# Patient Record
Sex: Female | Born: 1975 | Race: White | Hispanic: No | Marital: Married | Smoking: Never smoker
Health system: Southern US, Community
[De-identification: ages and names within clinical notes are randomized; demographics above are authoritative.]

## PROBLEM LIST (undated history)

## (undated) DIAGNOSIS — I10 Essential (primary) hypertension: Secondary | ICD-10-CM

## (undated) DIAGNOSIS — R7303 Prediabetes: Secondary | ICD-10-CM

## (undated) DIAGNOSIS — Z87442 Personal history of urinary calculi: Secondary | ICD-10-CM

## (undated) DIAGNOSIS — R002 Palpitations: Secondary | ICD-10-CM

## (undated) HISTORY — PX: KIDNEY STONE SURGERY: SHX686

## (undated) HISTORY — PX: TUBAL LIGATION: SHX77

## (undated) HISTORY — PX: ABLATION: SHX5711

---

## 2006-01-25 ENCOUNTER — Encounter: Admission: RE | Admit: 2006-01-25 | Discharge: 2006-01-25 | Payer: Self-pay | Admitting: Obstetrics and Gynecology

## 2006-09-20 IMAGING — MG MM DIGITAL DIAGNOSTIC BILAT CAD
6 series · 6 of 6 positions shown · non-contrast
Comparison: none

DIGITAL BILATERAL DIAGNOSTIC MAMMOGRAM WITH CAD AND RIGHT BREAST ULTRASOUND:
CLINICAL DATA: The patient palpates a lump in the inner aspect of the right breast at 3 o'clock 
near the sternum.  Her physician felt a lump in the outer portion of the right breast.

[R CC]
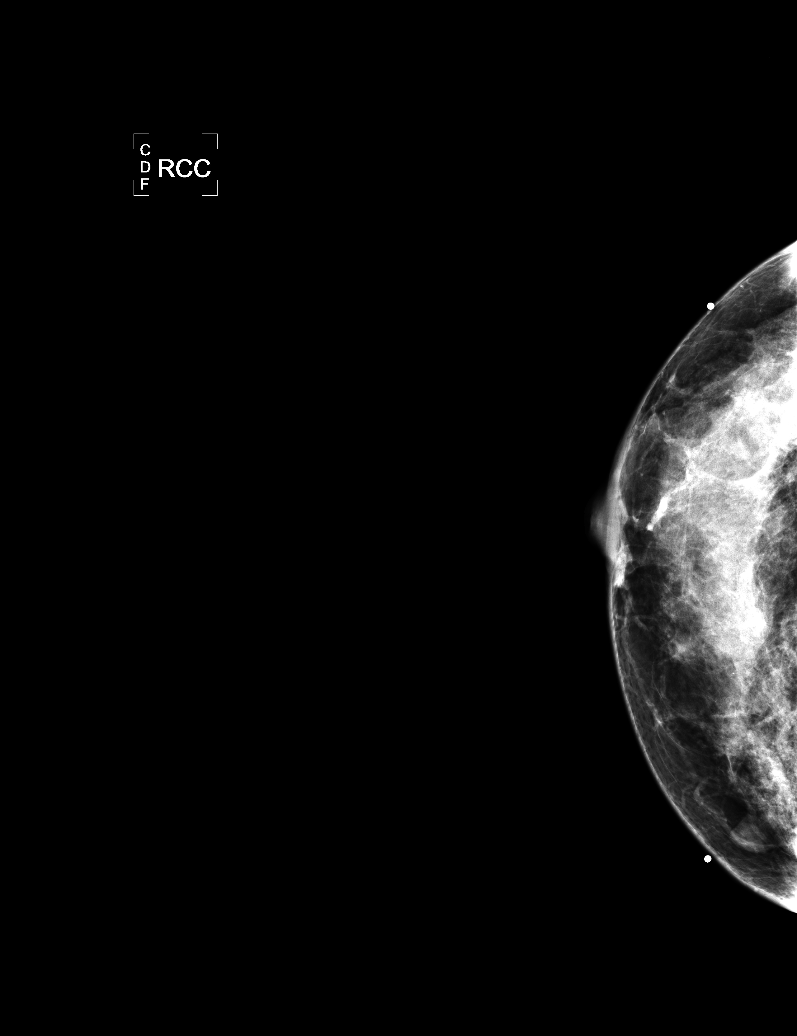

[L CC]
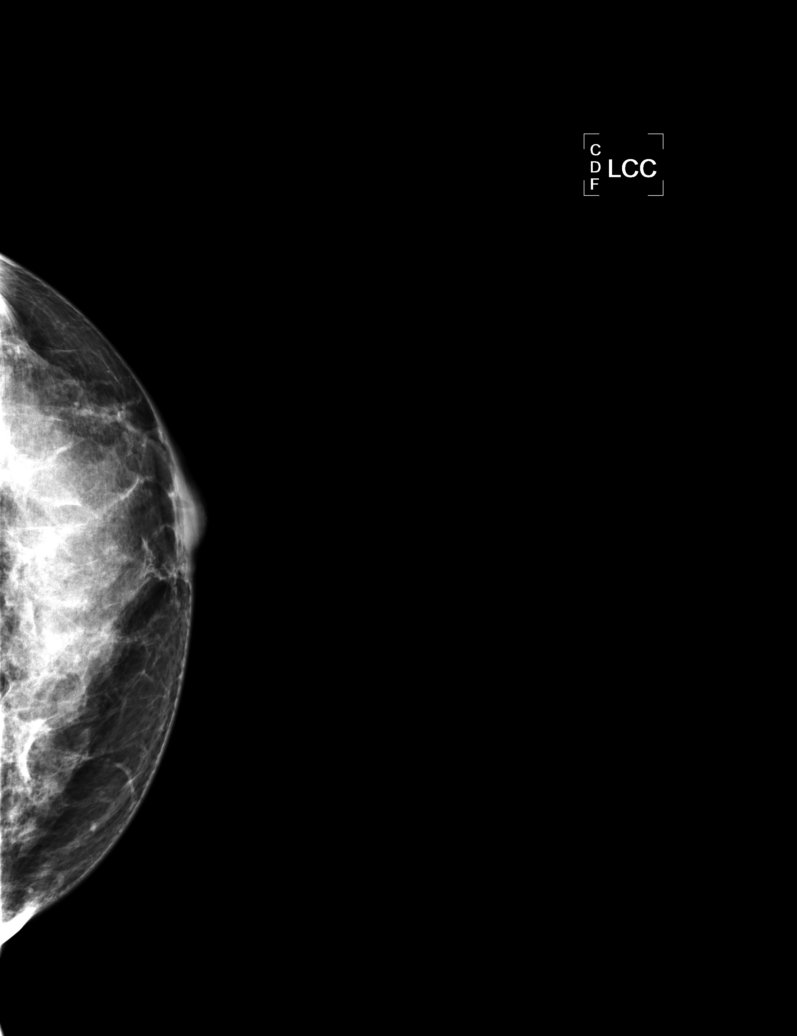

[L MLO]
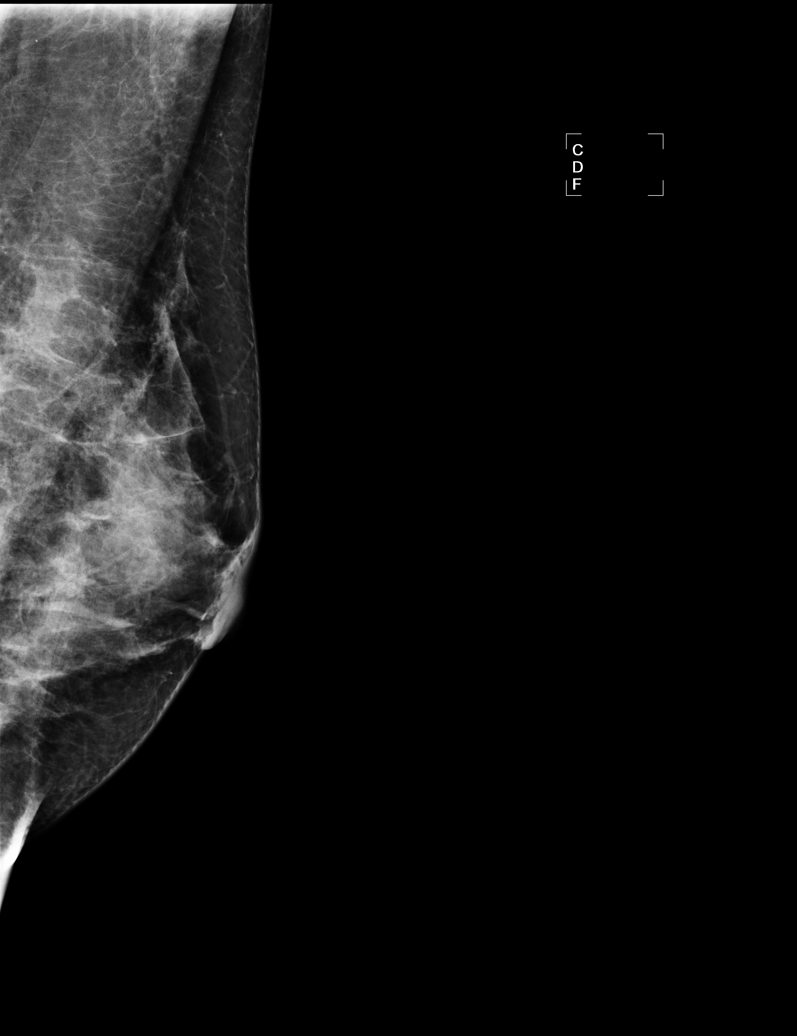

[R MLO]
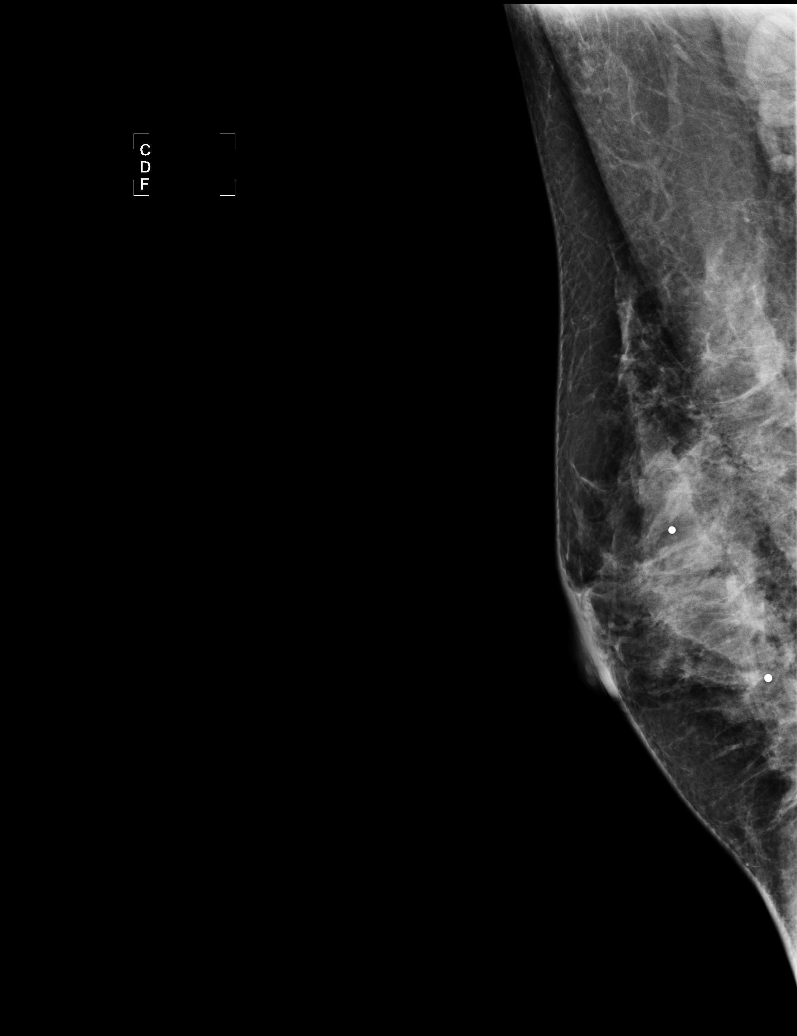

[R TAN (1 of 2)]
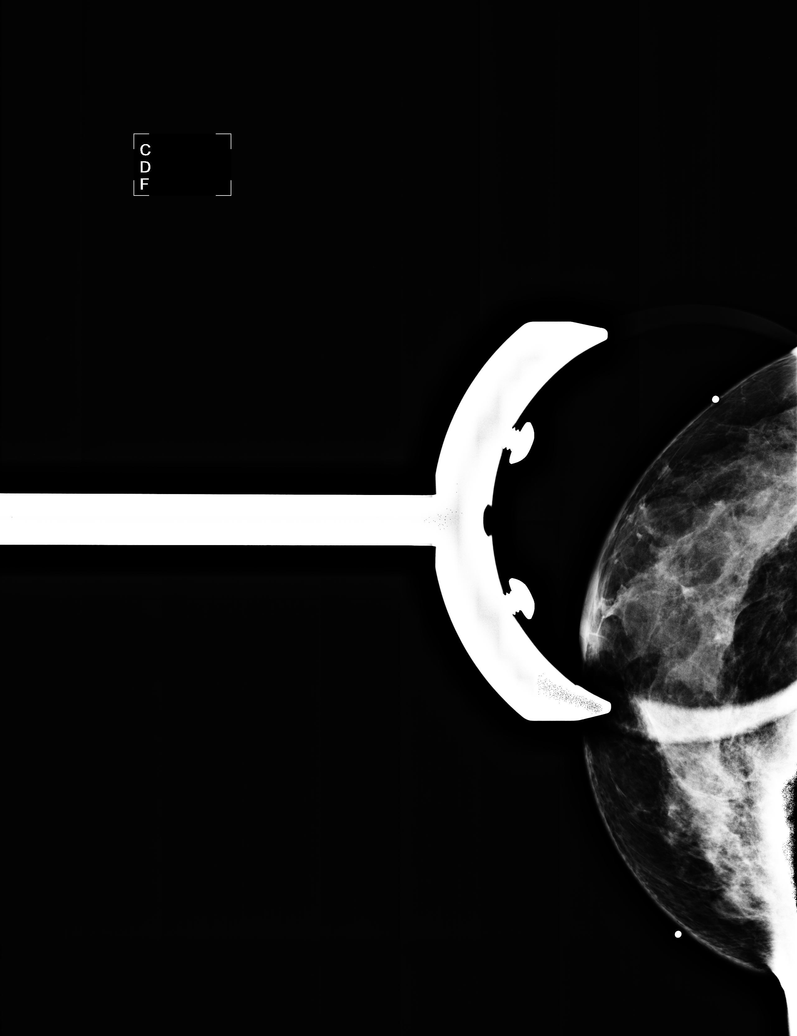

[R TAN (2 of 2)]
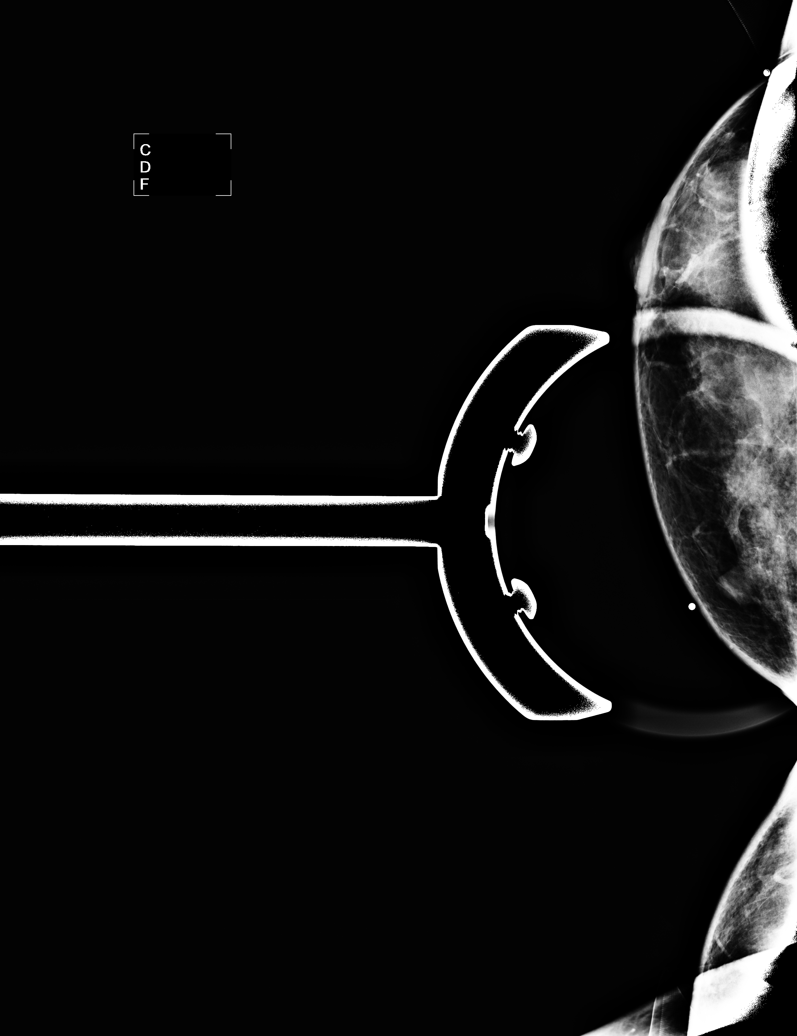

[6 of 6 positions shown; findings below may reference images not displayed]

This is the patient's initial study.  The breast tissue is extremely dense.  There is a round 
partially obscured, partially circumscribed nodule in the 3 o'clock position of the right breast 
far medially which corresponds to the palpable finding. No abnormality is noted in the outer 
portion of the right breast.  No abnormality is noted in the left breast.

On physical examination, I palpate a 1 cm nodule at 3 o'clock near the sternum on the right.  I do 
not palpate a mass in the outer portion of the right breast.    Sonography demonstrates a cyst at 3
o'clock measuring 8 x 6 x 8 mm.  There is some mobile debris within the cyst.

Sonography of the outer portion of the right breast demonstrates no mass, distortion or shadowing 
to suggest malignancy.
IMPRESSION: The palpable finding in the right breast at 3 o'clock corresponds to a small complicated cyst, a 
benign finding.  No abnormality is noted in the outer portion of the right breast and the patient 
and I are unable to palpate a mass in this area today.  Clinical follow-up of this area would be 
suggested.

Yearly screening mammography should beginning at age 40 unless clinically indicated earlier.

ASSESSMENT: Benign - BI-RADS 2

Routine screening mammogram at age 39.

## 2006-09-20 IMAGING — US US BREAST*R*
1 series · 6 of 6 positions shown · non-contrast
Comparison: none

DIGITAL BILATERAL DIAGNOSTIC MAMMOGRAM WITH CAD AND RIGHT BREAST ULTRASOUND:
CLINICAL DATA: The patient palpates a lump in the inner aspect of the right breast at 3 o'clock 
near the sternum.  Her physician felt a lump in the outer portion of the right breast.

[Series 1: us breast*right* · 6 of 6 slices shown]
[im 1/6]
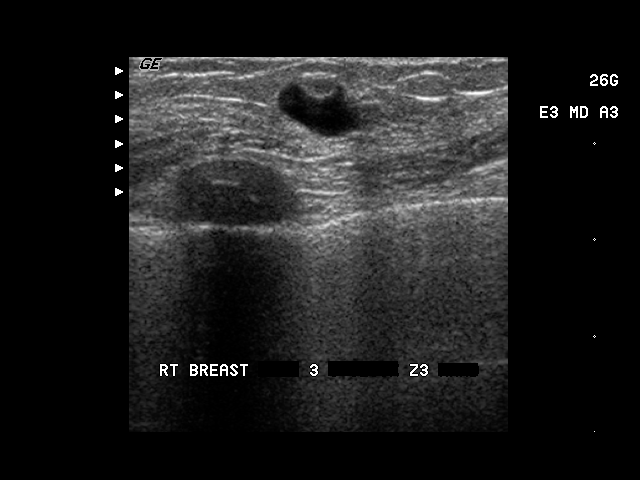
[im 2/6]
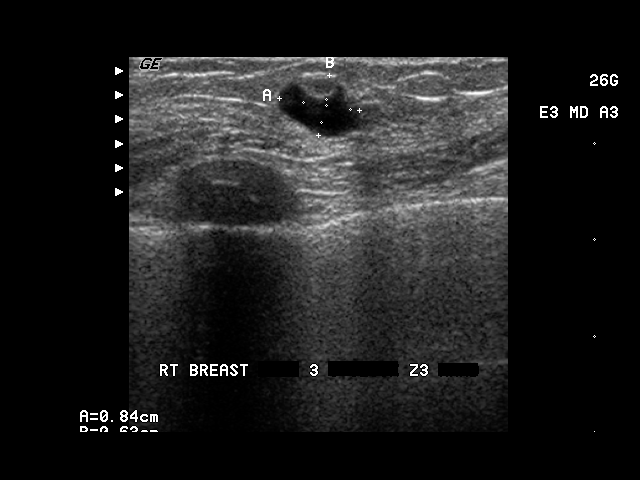
[im 3/6]
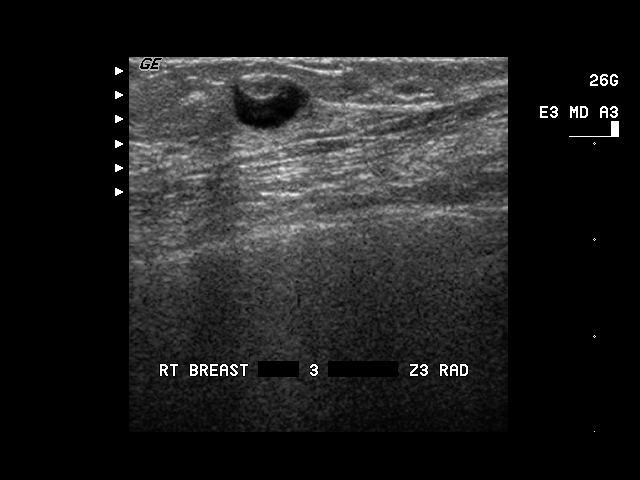
[im 4/6]
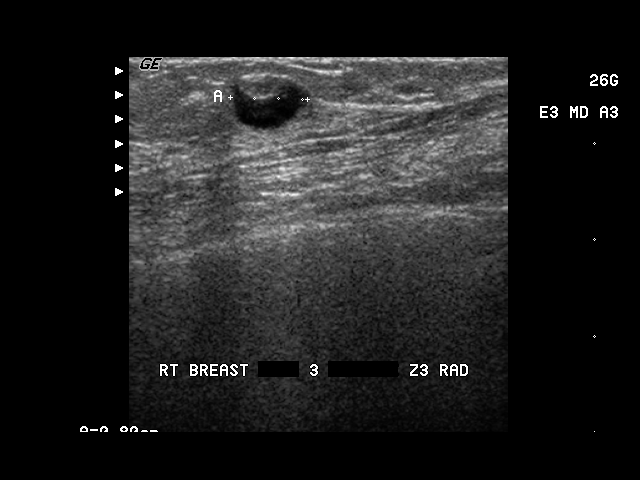
[im 5/6]
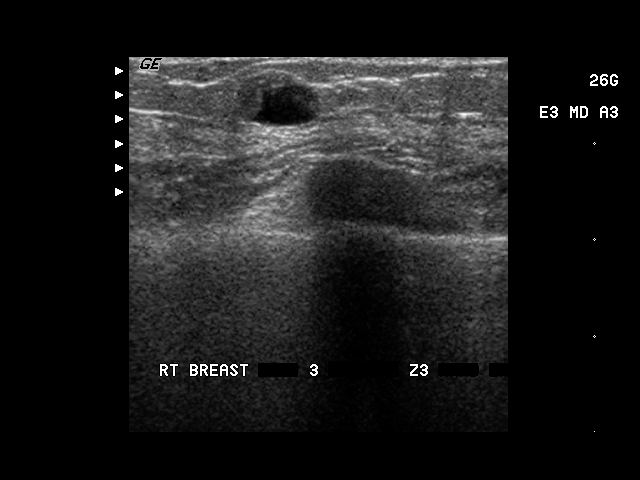
[im 6/6]
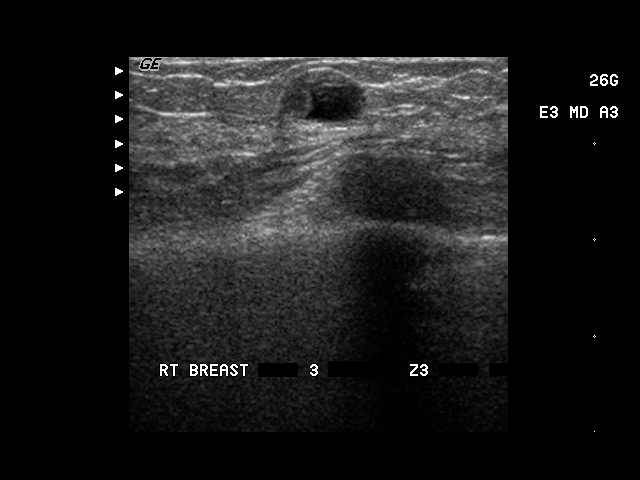

[6 of 6 positions shown; findings below may reference images not displayed]

This is the patient's initial study.  The breast tissue is extremely dense.  There is a round 
partially obscured, partially circumscribed nodule in the 3 o'clock position of the right breast 
far medially which corresponds to the palpable finding. No abnormality is noted in the outer 
portion of the right breast.  No abnormality is noted in the left breast.

On physical examination, I palpate a 1 cm nodule at 3 o'clock near the sternum on the right.  I do 
not palpate a mass in the outer portion of the right breast.    Sonography demonstrates a cyst at 3
o'clock measuring 8 x 6 x 8 mm.  There is some mobile debris within the cyst.

Sonography of the outer portion of the right breast demonstrates no mass, distortion or shadowing 
to suggest malignancy.
IMPRESSION: The palpable finding in the right breast at 3 o'clock corresponds to a small complicated cyst, a 
benign finding.  No abnormality is noted in the outer portion of the right breast and the patient 
and I are unable to palpate a mass in this area today.  Clinical follow-up of this area would be 
suggested.

Yearly screening mammography should beginning at age 40 unless clinically indicated earlier.

ASSESSMENT: Benign - BI-RADS 2

Routine screening mammogram at age 39.

## 2015-10-15 DIAGNOSIS — R002 Palpitations: Secondary | ICD-10-CM | POA: Insufficient documentation

## 2015-10-15 DIAGNOSIS — R7303 Prediabetes: Secondary | ICD-10-CM | POA: Insufficient documentation

## 2015-10-15 DIAGNOSIS — I10 Essential (primary) hypertension: Secondary | ICD-10-CM | POA: Insufficient documentation

## 2016-12-21 ENCOUNTER — Ambulatory Visit: Payer: PRIVATE HEALTH INSURANCE | Admitting: Sports Medicine

## 2016-12-22 ENCOUNTER — Ambulatory Visit (INDEPENDENT_AMBULATORY_CARE_PROVIDER_SITE_OTHER): Payer: PRIVATE HEALTH INSURANCE

## 2016-12-22 ENCOUNTER — Encounter: Payer: Self-pay | Admitting: Sports Medicine

## 2016-12-22 ENCOUNTER — Ambulatory Visit (INDEPENDENT_AMBULATORY_CARE_PROVIDER_SITE_OTHER): Payer: PRIVATE HEALTH INSURANCE | Admitting: Sports Medicine

## 2016-12-22 DIAGNOSIS — M2022 Hallux rigidus, left foot: Secondary | ICD-10-CM

## 2016-12-22 DIAGNOSIS — M25572 Pain in left ankle and joints of left foot: Secondary | ICD-10-CM | POA: Diagnosis not present

## 2016-12-22 DIAGNOSIS — M21619 Bunion of unspecified foot: Secondary | ICD-10-CM | POA: Diagnosis not present

## 2016-12-22 NOTE — Patient Instructions (Signed)
Hallux Rigidus Hallux rigidus is a type of joint pain or joint disease (arthritis) that affects your big toe (hallux). This condition involves the joint that connects the base of your big toe to the main part of your foot (metatarsophalangeal joint). This condition can cause your big toe to become stiff, painful, and difficult to move. Symptoms may get worse with movement or in cold or damp weather. The condition also gets worse over time. What are the causes? This condition may be caused by having a foot that does not function the way that it should or has an abnormal shape (structural deformity). These foot problems can run in families (be hereditary). This condition can also be caused by:  Injury.  Overuse.  Certain inflammatory diseases, including gout and rheumatoid arthritis. What increases the risk? This condition is more likely to develop in people who:  Have a foot bone (metatarsal) that is longer or higher than normal.  Have a family history of hallux rigidus.  Have previously injured their big toe.  Have feet that do not have a curve (arch) on the inner side of the foot. This may be called flat feet or fallen arches.  Turn their ankles in when they walk (pronation).  Have rheumatoid arthritis or gout.  Have to stoop down often at work. What are the signs or symptoms? Symptoms of this condition include:  Big toe pain.  Stiffness and difficulty moving the big toe.  Swelling of the toe and surrounding area.  Bone spurs. These are bony growths that can form on the joint of the big toe.  A limp. How is this diagnosed? This condition is diagnosed based on a medical history and physical exam. This may include X-rays. How is this treated? Treatment for this condition includes:  Wearing roomy, comfortable shoes that have a large toe box.  Putting orthotic devices in your shoes.  Pain medicines.  Physical therapy.  Icing the injured area.  Alternate between  putting your foot in cold water then warm water. If your condition is severe, treatment may include:  Corticosteroid injections to relieve pain.  Surgery to remove bone spurs, fuse damaged bones together, or replace the entire joint. Follow these instructions at home:  Take over-the-counter and prescription medicines only as told by your health care provider.  Do not wear high heels or other restrictive footwear. Wear comfortable, supportive shoes that have a large toe box.  Wear orthotics as told by your health care provider, if this applies.  Put your feet in cold water for 30 seconds, then in warm water for 30 seconds. Alternate between the cold and warm water for 5 minutes. Do this several times a day or as told by your health care provider.  If directed, apply ice to the injured area.  Put ice in a plastic bag.  Place a towel between your skin and the bag.  Leave the ice on for 20 minutes, 2-3 times per day.  Do foot exercises as instructed by your health care provider or a physical therapist.  Keep all follow-up visits as told by your health care provider. This is important. Contact a health care provider if:  You notice bone spurs or growths on or around your big toe.  Your pain does not get better or it gets worse.  You have pain while resting.  You have pain in other parts of your body, such as your back, hip, or knee.  You start to limp. This information is not intended   to replace advice given to you by your health care provider. Make sure you discuss any questions you have with your health care provider. Document Released: 09/19/2005 Document Revised: 02/25/2016 Document Reviewed: 05/27/2015 Elsevier Interactive Patient Education  2017 [employer]  

## 2016-12-22 NOTE — Progress Notes (Signed)
Subjective: Judy Chavez is a 41 y.o. female patient who presents to office for evaluation of Left bunion pain. Patient complains of progressive pain especially over the last year in the Left foot that starts as pain over the bump with direct pressure and range of motion; patient now has difficulty fitting shoes comfortably. Reports pain really got bad when on Sunday a toddler at her nursery at church stepped on her foot. Patient has tried changing shoes with no improvement in symptoms. Patient denies any other pedal complaints.   Patient Active Problem List   Diagnosis Date Noted  . Essential hypertension 10/15/2015  . Palpitations 10/15/2015  . Prediabetes 10/15/2015    No current outpatient prescriptions on file prior to visit.   No current facility-administered medications on file prior to visit.     Allergies  Allergen Reactions  . Amlodipine Other (See Comments)    unknown    Objective:  General: Alert and oriented x3 in no acute distress  Dermatology: No open lesions bilateral lower extremities, no webspace macerations, no ecchymosis bilateral, all nails x 10 are well manicured.  Vascular: Dorsalis Pedis and Posterior Tibial pedal pulses 2/4, Capillary Fill Time 3 seconds, (+) pedal hair growth bilateral, no edema bilateral lower extremities, Temperature gradient within normal limits.  Neurology: Gross sensation intact via light touch bilateral. (-) Tinels sign left foot.   Musculoskeletal: Mild tenderness with palpation left dorsal bunion deformity, + limitation with range of motion, less than 30 of dorsiflexion and 5 and plantarflexion at left big toe joint, supportive of hallux limitus/rigidus.   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 No other acute findings.      Assessment and Plan: Problem List Items Addressed This Visit    None    Visit Diagnoses    Hallux rigidus of left foot     -  Primary   Relevant Orders   DG Foot 2 Views Left   Toe joint pain, left           -Complete examination performed -Xrays reviewed -Discussed treatement options; discussed Hallux rigidus deformity;conservative and  Surgical management; risks, benefits, alternatives discussed. All patient's questions answered.   -Rx meloxicam attempted to call Patient to get pharmacy to send medicine; will have office staff to reach Patient to send medicine -Recommend continue with good supportive shoes and inserts.  -Recommend topical pain creams or rubs Recommend heat and ice therapy -Dispense offloading pad to offload the first metatarsophalangeal joint and instructed on use -Advised patient to consider surgery if felt to continue to improve with conservative measures -Patient to return to office as needed or sooner if condition worsens.  Asencion Islamitorya Markayla Reichart, DPM

## 2016-12-23 ENCOUNTER — Telehealth: Payer: Self-pay | Admitting: *Deleted

## 2016-12-23 NOTE — Telephone Encounter (Signed)
-----   Message from Asencion Islamitorya Stover, [place] sent at 12/22/2016  5:06 PM EDT ----- Regarding: Send Mobic to Pharmacy  Patient left office today and my nurse did not get pharmacy. I tried calling the patient and she did not answer and the voicemail was full. Can you please call her to get her Pharmacy and send Meloxicam 15mg  daily (30 tabs) to pharmacy to use for hallux rigidus, left big toe joint pain. Thanks Dr. Marylene LandStover

## 2016-12-23 NOTE — Telephone Encounter (Signed)
I attempted to contact the patient again and was unable to leave a message

## 2016-12-26 NOTE — Telephone Encounter (Signed)
Left message on mobile requesting local pharmacy to call in antiinflammatory pain medication. Left message on work phone to call with local pharmacy.

## 2018-02-12 ENCOUNTER — Other Ambulatory Visit: Payer: Self-pay | Admitting: Cardiology

## 2018-02-14 ENCOUNTER — Other Ambulatory Visit: Payer: Self-pay | Admitting: Cardiology

## 2023-11-13 ENCOUNTER — Other Ambulatory Visit (HOSPITAL_BASED_OUTPATIENT_CLINIC_OR_DEPARTMENT_OTHER): Payer: Self-pay | Admitting: Family

## 2023-11-13 DIAGNOSIS — R102 Pelvic and perineal pain: Secondary | ICD-10-CM

## 2023-11-13 DIAGNOSIS — R1011 Right upper quadrant pain: Secondary | ICD-10-CM

## 2023-11-13 DIAGNOSIS — R1031 Right lower quadrant pain: Secondary | ICD-10-CM

## 2023-11-16 ENCOUNTER — Ambulatory Visit (INDEPENDENT_AMBULATORY_CARE_PROVIDER_SITE_OTHER)
Admission: RE | Admit: 2023-11-16 | Discharge: 2023-11-16 | Disposition: A | Discharge status: Home / Self Care | Payer: Managed Care, Other (non HMO) | Source: Ambulatory Visit | Attending: Family | Admitting: Family

## 2023-11-16 DIAGNOSIS — R102 Pelvic and perineal pain: Secondary | ICD-10-CM

## 2023-11-16 DIAGNOSIS — R1031 Right lower quadrant pain: Secondary | ICD-10-CM

## 2023-11-16 DIAGNOSIS — R1011 Right upper quadrant pain: Secondary | ICD-10-CM | POA: Diagnosis not present

## 2023-11-16 MED ORDER — IOHEXOL 300 MG/ML  SOLN
100.0000 mL | Freq: Once | INTRAMUSCULAR | Status: AC | PRN
  Administered 2023-11-16: 100 mL via INTRAVENOUS

## 2023-12-12 ENCOUNTER — Other Ambulatory Visit: Payer: Self-pay | Admitting: Urology

## 2023-12-15 ENCOUNTER — Encounter (HOSPITAL_COMMUNITY): Payer: Self-pay

## 2023-12-15 NOTE — Patient Instructions (Signed)
 SURGICAL WAITING ROOM VISITATION  Patients having surgery or a procedure may have no more than 2 support people in the waiting area - these visitors may rotate.    Children under the age of 95 must have an adult with them who is not the patient.  Due to an increase in RSV and influenza rates and associated hospitalizations, children ages 74 and under may not visit patients in [facility] hospitals.  Visitors with respiratory illnesses are discouraged from visiting and should remain at home.  If the patient needs to stay at the hospital during part of their recovery, the visitor guidelines for inpatient rooms apply. Pre-op nurse will coordinate an appropriate time for 1 support person to accompany patient in pre-op.  This support person may not rotate.    Please refer to the [facility] website for the visitor guidelines for Inpatients (after your surgery is over and you are in a regular room).       Your procedure is scheduled on: 12-21-23   Report to [facility] Main Entrance    Report to admitting at      10:00  AM   Call this number if you have problems the morning of surgery [phone]   Do not eat food :After Midnight.   After Midnight you may have the following liquids until _0600 _____ AM/  DAY OF SURGERY  then nothing by mouth  Water Non-Citrus Juices (without pulp, NO RED-Apple, White grape, White cranberry) Black Coffee (NO MILK/CREAM OR CREAMERS, sugar ok)  Clear Tea (NO MILK/CREAM OR CREAMERS, sugar ok) regular and decaf                             Plain Jell-O (NO RED)                                           Fruit ices (not with fruit pulp, NO RED)                                     Popsicles (NO RED)                                                               Sports drinks like Gatorade (NO RED)                           If you have questions, please contact your surgeon's office.   FOLLOW ANY ADDITIONAL PRE OP INSTRUCTIONS YOU RECEIVED FROM  YOUR SURGEON'S OFFICE!!!     Oral Hygiene is also important to reduce your risk of infection.                                    Remember - BRUSH YOUR TEETH THE MORNING OF SURGERY WITH YOUR REGULAR TOOTHPASTE  DENTURES WILL BE REMOVED PRIOR TO SURGERY PLEASE DO NOT APPLY "Poly grip" OR ADHESIVES!!!   Do NOT smoke after Midnight  Stop all vitamins and herbal supplements 7 days before surgery.   Take these medicines the morning of surgery with A SIP OF WATER: NONE    Bring CPAP mask and tubing day of surgery.                              You may not have any metal on your body including hair pins, jewelry, and body piercing             Do not wear make-up, lotions, powders, perfumes/cologne, or deodorant  Do not wear nail polish including gel and S&S, artificial/acrylic nails, or any other type of covering on natural nails including finger and toenails. If you have artificial nails, gel coating, etc. that needs to be removed by a nail salon please have this removed prior to surgery or surgery may need to be canceled/ delayed if the surgeon/ anesthesia feels like they are unable to be safely monitored.   Do not shave  48 hours prior to surgery.          Do not bring valuables to the hospital. [facility] IS NOT             RESPONSIBLE   FOR VALUABLES.   Contacts, glasses, dentures or bridgework may not be worn into surgery.   Bring small overnight bag day of surgery.   DO NOT BRING YOUR HOME MEDICATIONS TO THE HOSPITAL. PHARMACY WILL DISPENSE MEDICATIONS LISTED ON YOUR MEDICATION LIST TO YOU DURING YOUR ADMISSION IN THE HOSPITAL!    Patients discharged on the day of surgery will not be allowed to drive home.  Someone NEEDS to stay with you for the first 24 hours after anesthesia.   Special Instructions: Bring a copy of your healthcare power of attorney and living will documents the day of surgery if you haven't scanned them before.              Please read over the following  fact sheets you were given: IF YOU HAVE QUESTIONS ABOUT YOUR PRE-OP INSTRUCTIONS PLEASE CALL [phone]    If you test positive for Covid or have been in contact with anyone that has tested positive in the last 10 days please notify you surgeon.    [place] - Preparing for Surgery Before surgery, you can play an important role.  Because skin is not sterile, your skin needs to be as free of germs as possible.  You can reduce the number of germs on your skin by washing with CHG (chlorahexidine gluconate) soap before surgery.  CHG is an antiseptic cleaner which kills germs and bonds with the skin to continue killing germs even after washing. Please DO NOT use if you have an allergy to CHG or antibacterial soaps.  If your skin becomes reddened/irritated stop using the CHG and inform your nurse when you arrive at Short Stay. Do not shave (including legs and underarms) for at least 48 hours prior to the first CHG shower.  You may shave your face/neck. Please follow these instructions carefully:  1.  Shower with CHG Soap the night before surgery and the  morning of Surgery.  2.  If you choose to wash your hair, wash your hair first as usual with your  normal  shampoo.  3.  After you shampoo, rinse your hair and body thoroughly to remove the  shampoo.  4.  Use CHG as you would any other liquid soap.  You can apply chg directly  to the skin and wash                       Gently with a scrungie or clean washcloth.  5.  Apply the CHG Soap to your body ONLY FROM THE NECK DOWN.   Do not use on face/ open                           Wound or open sores. Avoid contact with eyes, ears mouth and genitals (private parts).                       Wash face,  Genitals (private parts) with your normal soap.             6.  Wash thoroughly, paying special attention to the area where your surgery  will be performed.  7.  Thoroughly rinse your body with warm water from the neck down.  8.  DO NOT  shower/wash with your normal soap after using and rinsing off  the CHG Soap.                9.  Pat yourself dry with a clean towel.            10.  Wear clean pajamas.            11.  Place clean sheets on your bed the night of your first shower and do not  sleep with pets. Day of Surgery : Do not apply any lotions/deodorants the morning of surgery.  Please wear clean clothes to the hospital/surgery center.  FAILURE TO FOLLOW THESE INSTRUCTIONS MAY RESULT IN THE CANCELLATION OF YOUR SURGERY PATIENT SIGNATURE_________________________________  NURSE SIGNATURE__________________________________  ________________________________________________________________________

## 2023-12-15 NOTE — Progress Notes (Addendum)
 PCP - Santo Held, NP [facility] [place], [place] Cardiologist - [facility] LOV 01-03-2017  PPM/ICD -  Device Orders -  Rep Notified -   Chest x-ray -  EKG -  Stress Test -  ECHO -  Cardiac Cath -   Sleep Study -  CPAP -   Fasting Blood Sugar -  Checks Blood Sugar _____ times a day  Blood Thinner Instructions: Aspirin Instructions:  ERAS Protcol - PRE-SURGERY n/a   COVID vaccine -yes  Activity--Able to climb a flight of stairs without CP or SOB Anesthesia review:   Patient denies shortness of breath, fever, cough and chest pain at PAT appointment   All instructions explained to the patient, with a verbal understanding of the material. Patient agrees to go over the instructions while at home for a better understanding. Patient also instructed to self quarantine after being tested for COVID-19. The opportunity to ask questions was provided.

## 2023-12-19 NOTE — H&P (Signed)
 48 year old female with a history of bilateral nephrolithiasis. She was seen by her PCP with a history of right sided pain that has been present for several years per patient she had ultrasound Leukine which she stated was normal unclear if this is a renal ultrasound. Encouraged about 2-3 times a week. Patient also states she has frequency with urination.   PMH:HTN  ZHY:QMVHQ ligatoin, D&C, uterine ablation   Nephrolithiasis:  12/04/2023: Right UPJ stone 7 mm in diameter, small 3 to 4 mm left-sided stones. previous Hx of stone in 2005, patient is still occasionally having pain on that side. Pain only on the R side. No GH, dysuria. No infectious symptoms. Patient had URS in 2005. Ultrasound shows no hydronephrosis, x-ray demonstrates no calcifications along the length of the ureter and previous location noted on CT.   12/21/23: PT failed TOP and would like to proceed with URS. She is here today for URS.     ALLERGIES: None   MEDICATIONS: MAGNESIUM GLYCINATE Daily  MULTI FOR HER Daily  TELMISARTAN PO Daily     GU PSH: Endometrial Ablation - about 2009     NON-GU PSH: Anesth Kidney Stone Destruct Anesth, Tubal Ligation - about 2007 Colonoscopy - about 07/03/2022     GU PMH: None   NON-GU PMH: Hypertension    FAMILY HISTORY: Colon Cancer - Grandmother Diabetes - Sister Generalized Multiple Sclerosis - Mother Parkinson's Disease - Father   SOCIAL HISTORY: Marital Status: Married Preferred Language: English; Ethnicity: Not Hispanic Or Latino; Race: White Current Smoking Status: Patient has never smoked.  Does not use smokeless tobacco. Has never drank.  Does not use drugs. Drinks 3 caffeinated drinks per day. Has not had a blood transfusion. Patient's occupation Industrial/product designer .    REVIEW OF SYSTEMS:    GU Review Female:   Patient reports frequent urination, get up at night to urinate, and leakage of urine. Patient denies hard to postpone urination, burning  /pain with urination, stream starts and stops, trouble starting your stream, have to strain to urinate, and being pregnant.  Gastrointestinal (Upper):   Patient reports nausea. Patient denies vomiting and indigestion/ heartburn.  Gastrointestinal (Lower):   Patient reports diarrhea. Patient denies constipation.  Constitutional:   Patient reports fatigue. Patient denies fever, night sweats, and weight loss.  Skin:   Patient denies skin rash/ lesion and itching.  Eyes:   Patient denies blurred vision and double vision.  Ears/ Nose/ Throat:   Patient denies sore throat and sinus problems.  Hematologic/Lymphatic:   Patient denies swollen glands and easy bruising.  Cardiovascular:   Patient denies leg swelling and chest pains.  Respiratory:   Patient denies cough and shortness of breath.  Endocrine:   Patient denies excessive thirst.  Musculoskeletal:   Patient denies back pain and joint pain.  Neurological:   Patient denies headaches and dizziness.  Psychologic:   Patient denies depression and anxiety.   VITAL SIGNS:      12/04/2023 10:08 AM  BP 141/82 mmHg  Pulse 84 /min  Temperature 97.8 F / 36.5 C   MULTI-SYSTEM PHYSICAL EXAMINATION:    Constitutional: Well-nourished. No physical deformities. Normally developed. Good grooming.  Respiratory: No labored breathing, no use of accessory muscles.   Cardiovascular: Normal temperature, normal extremity pulses, no swelling, no varicosities.  Skin: No paleness, no jaundice, no cyanosis. No lesion, no ulcer, no rash.  Musculoskeletal: Normal gait and station of head and neck.     Complexity of Data:  Source Of  History:  Patient  Records Review:   Previous Patient Records  Urine Test Review:   Urinalysis  X-Ray Review: KUB: Reviewed Films. Lower pole kidney stone right side, upper pole kidney stone left side no calcifications along the ureter. Renal Ultrasound: Reviewed Films. No hydronephrosis bilaterally right kidney with right sided inferior  pole stone left kidney with multiple nonobstructing stones C.T. Abdomen/Pelvis: Reviewed Films. Reviewed Report. Multiple nonobstructing stones in left kidney no hydronephrosis noted. Mild hydronephrosis right side with a right UPJ stone no kidney masses bilaterally    PROCEDURES:         KUB - 78295  A single view of the abdomen is obtained.      Patient confirmed No Neulasta OnPro Device.            Renal Ultrasound - 62130  Right Kidney: Length: 11.1 cm Depth: 4.6 cm Cortical Width: 1.3 cm Width: 4.6 cm  Left Kidney: Length: 12.0 cm Depth: 5.6 cm Cortical Width: 1.7 cm Width: 5.7 cm  Left Kidney/Ureter:  Multiple non-obstructing calcifications   Right Kidney/Ureter:  Hydro resolved. Non-obstructing calcification LP.  Bladder:  PVR 63.59 ml      Patient confirmed No Neulasta OnPro Device.           Visit Complexity - G2211          Urinalysis Dipstick Dipstick Cont'd  Color: Yellow Bilirubin: Neg mg/dL  Appearance: Clear Ketones: Neg mg/dL  Specific Gravity: 8.657 Blood: Neg ery/uL  pH: 6.0 Protein: Neg mg/dL  Glucose: Neg mg/dL Urobilinogen: 0.2 mg/dL    Nitrites: Neg    Leukocyte Esterase: Neg leu/uL    ASSESSMENT:      ICD-10 Details  1 GU:   Ureteral calculus - N20.1 Undiagnosed New Problem   PLAN:           Orders Labs Uric Acid, PTH w/Calcium, Vitamin D 25 and 1,25  X-Rays: KUB    Renal Ultrasound          Schedule X-Rays: 6 Months - KUB  Return Visit/Planned Activity: 6 Months - Office Visit          Document Letter(s):  Created for Patient: Clinical Summary         Notes:   Nephrolithiasis: UA negative for blood, KUB and ultrasound reassuring no residual stone. Will plan to follow-up in 6 months with 24-hour urine stone labs today and KUB. Gave general stone prevention guidelines including increased fluid intake, limiting salt, increasing citrate intake. Patient instructed to call back if pain in right side worsens.   We discussed risk  benefits alternatives to ureteroscopy.  This included bleeding infection and damage to surrounding structures surrounding structures including ureter as well as urethra.  We discussed the need for stent postoperatively as well as the potential symptoms of stent placement.  We discussed possible inability to complete procedure due to caliber of your ureter or inability to pass stone possibly requiring long-term stent versus nephrostomy tube.  We discussed need for possible second surgery.  Patient voiced their understanding and consent was obtained.

## 2023-12-20 ENCOUNTER — Encounter (HOSPITAL_COMMUNITY): Payer: Self-pay

## 2023-12-20 ENCOUNTER — Other Ambulatory Visit: Payer: Self-pay

## 2023-12-20 ENCOUNTER — Encounter (HOSPITAL_COMMUNITY)
Admission: RE | Admit: 2023-12-20 | Discharge: 2023-12-20 | Disposition: A | Discharge status: Home / Self Care | Source: Ambulatory Visit | Attending: Urology | Admitting: Urology

## 2023-12-20 VITALS — BP 137/98 | HR 86 | Temp 98.7°F | Resp 16 | Ht 64.0 in | Wt 142.0 lb

## 2023-12-20 DIAGNOSIS — R9431 Abnormal electrocardiogram [ECG] [EKG]: Secondary | ICD-10-CM | POA: Diagnosis not present

## 2023-12-20 DIAGNOSIS — Z01818 Encounter for other preprocedural examination: Secondary | ICD-10-CM | POA: Diagnosis present

## 2023-12-20 DIAGNOSIS — I10 Essential (primary) hypertension: Secondary | ICD-10-CM | POA: Insufficient documentation

## 2023-12-20 HISTORY — DX: Palpitations: R00.2

## 2023-12-20 HISTORY — DX: Prediabetes: R73.03

## 2023-12-20 HISTORY — DX: Essential (primary) hypertension: I10

## 2023-12-20 HISTORY — DX: Personal history of urinary calculi: Z87.442

## 2023-12-20 LAB — BASIC METABOLIC PANEL
Anion gap: 8 (ref 5–15)
BUN: 13 mg/dL (ref 6–20)
CO2: 24 mmol/L (ref 22–32)
Calcium: 8.9 mg/dL (ref 8.9–10.3)
Chloride: 105 mmol/L (ref 98–111)
Creatinine, Ser: 0.7 mg/dL (ref 0.44–1.00)
GFR, Estimated: >60 mL/min (ref >60)
Glucose, Bld: 104 mg/dL — ABNORMAL HIGH (ref 70–99)
Potassium: 4.4 mmol/L (ref 3.5–5.1)
Sodium: 137 mmol/L (ref 135–145)

## 2023-12-20 LAB — CBC
HCT: 44.3 % (ref 36.0–46.0)
Hemoglobin: 14.4 g/dL (ref 12.0–15.0)
MCH: 27.5 pg (ref 26.0–34.0)
MCHC: 32.5 g/dL (ref 30.0–36.0)
MCV: 84.5 fL (ref 80.0–100.0)
Platelets: 250 10*3/uL (ref 150–400)
RBC: 5.24 MIL/uL — ABNORMAL HIGH (ref 3.87–5.11)
RDW: 14.5 % (ref 11.5–15.5)
WBC: 7.4 10*3/uL (ref 4.0–10.5)
nRBC: 0 % (ref 0.0–0.2)

## 2023-12-21 ENCOUNTER — Ambulatory Visit (HOSPITAL_BASED_OUTPATIENT_CLINIC_OR_DEPARTMENT_OTHER): Admitting: Anesthesiology

## 2023-12-21 ENCOUNTER — Encounter (HOSPITAL_COMMUNITY)
Admission: RE | Disposition: A | Discharge status: Home / Self Care | Payer: Self-pay | Source: Home / Self Care | Attending: Urology

## 2023-12-21 ENCOUNTER — Ambulatory Visit (HOSPITAL_COMMUNITY)

## 2023-12-21 ENCOUNTER — Encounter (HOSPITAL_COMMUNITY): Payer: Self-pay | Admitting: Urology

## 2023-12-21 ENCOUNTER — Ambulatory Visit (HOSPITAL_COMMUNITY)
Admission: RE | Admit: 2023-12-21 | Discharge: 2023-12-21 | Disposition: A | Discharge status: Home / Self Care | Attending: Urology | Admitting: Urology

## 2023-12-21 ENCOUNTER — Ambulatory Visit (HOSPITAL_COMMUNITY): Payer: Self-pay | Admitting: Physician Assistant

## 2023-12-21 DIAGNOSIS — I10 Essential (primary) hypertension: Secondary | ICD-10-CM | POA: Diagnosis not present

## 2023-12-21 DIAGNOSIS — N201 Calculus of ureter: Secondary | ICD-10-CM | POA: Insufficient documentation

## 2023-12-21 HISTORY — PX: CYSTOSCOPY/URETEROSCOPY/HOLMIUM LASER/STENT PLACEMENT: SHX6546

## 2023-12-21 LAB — POCT PREGNANCY, URINE: Preg Test, Ur: NEGATIVE

## 2023-12-21 SURGERY — CYSTOSCOPY/URETEROSCOPY/HOLMIUM LASER/STENT PLACEMENT
Anesthesia: General | Laterality: Right

## 2023-12-21 MED ORDER — ACETAMINOPHEN 10 MG/ML IV SOLN: 1000.0000 mg | Freq: Once | INTRAVENOUS | Status: DC | PRN

## 2023-12-21 MED ORDER — ONDANSETRON HCL 4 MG/2ML IJ SOLN
INTRAMUSCULAR | Status: DC | PRN
  Administered 2023-12-21: 4 mg via INTRAVENOUS

## 2023-12-21 MED ORDER — LIDOCAINE HCL (PF) 2 % IJ SOLN
INTRAMUSCULAR | Status: DC | PRN
  Administered 2023-12-21: 60 mg via INTRADERMAL

## 2023-12-21 MED ORDER — PROPOFOL 10 MG/ML IV BOLUS
INTRAVENOUS | Status: DC | PRN
  Administered 2023-12-21: 180 mg via INTRAVENOUS

## 2023-12-21 MED ORDER — CEFAZOLIN SODIUM-DEXTROSE 2-4 GM/100ML-% IV SOLN
2.0000 g | INTRAVENOUS | Status: AC
  Administered 2023-12-21: 2 g via INTRAVENOUS
  Filled 2023-12-21: qty 100

## 2023-12-21 MED ORDER — SUGAMMADEX SODIUM 200 MG/2ML IV SOLN
INTRAVENOUS | Status: AC
  Filled 2023-12-21: qty 4

## 2023-12-21 MED ORDER — FENTANYL CITRATE (PF) 100 MCG/2ML IJ SOLN
INTRAMUSCULAR | Status: DC | PRN
Start: 2023-12-21 — End: 2023-12-21
  Administered 2023-12-21 (×2): 50 ug via INTRAVENOUS

## 2023-12-21 MED ORDER — LACTATED RINGERS IV SOLN: INTRAVENOUS | Status: DC | PRN

## 2023-12-21 MED ORDER — LACTATED RINGERS IV SOLN: INTRAVENOUS | Status: DC

## 2023-12-21 MED ORDER — CEFAZOLIN SODIUM-DEXTROSE 2-4 GM/100ML-% IV SOLN
INTRAVENOUS | Status: AC
  Filled 2023-12-21: qty 100

## 2023-12-21 MED ORDER — FENTANYL CITRATE (PF) 100 MCG/2ML IJ SOLN
INTRAMUSCULAR | Status: AC
Start: 2023-12-21 — End: ?
  Filled 2023-12-21: qty 2

## 2023-12-21 MED ORDER — TAMSULOSIN HCL 0.4 MG PO CAPS: 0.4000 mg | ORAL_CAPSULE | Freq: Every day | ORAL | 0 refills | Status: AC

## 2023-12-21 MED ORDER — CHLORHEXIDINE GLUCONATE 0.12 % MT SOLN
15.0000 mL | Freq: Once | OROMUCOSAL | Status: AC
  Administered 2023-12-21: 15 mL via OROMUCOSAL

## 2023-12-21 MED ORDER — MIDAZOLAM HCL 2 MG/2ML IJ SOLN
INTRAMUSCULAR | Status: AC
  Filled 2023-12-21: qty 2

## 2023-12-21 MED ORDER — HYOSCYAMINE SULFATE 0.125 MG PO TBDP
0.1250 mg | ORAL_TABLET | Freq: Four times a day (QID) | ORAL | 0 refills | Status: AC | PRN
Start: 2023-12-21 — End: ?

## 2023-12-21 MED ORDER — SODIUM CHLORIDE 0.9 % IR SOLN
Status: DC | PRN
  Administered 2023-12-21: 6000 mL

## 2023-12-21 MED ORDER — ORAL CARE MOUTH RINSE: 15.0000 mL | Freq: Once | OROMUCOSAL | Status: AC

## 2023-12-21 MED ORDER — PHENAZOPYRIDINE HCL 200 MG PO TABS: 200.0000 mg | ORAL_TABLET | Freq: Three times a day (TID) | ORAL | 0 refills | Status: AC | PRN

## 2023-12-21 MED ORDER — IOHEXOL 300 MG/ML  SOLN
INTRAMUSCULAR | Status: DC | PRN
  Administered 2023-12-21: 8 mL

## 2023-12-21 MED ORDER — DEXAMETHASONE SODIUM PHOSPHATE 10 MG/ML IJ SOLN
INTRAMUSCULAR | Status: DC | PRN
  Administered 2023-12-21: 10 mg via INTRAVENOUS
  Administered 2023-12-21: 5 mg via INTRAVENOUS

## 2023-12-21 MED ORDER — HYDROMORPHONE HCL 1 MG/ML IJ SOLN: 0.2500 mg | INTRAMUSCULAR | Status: DC | PRN

## 2023-12-21 MED ORDER — METHOCARBAMOL 750 MG PO TABS: 750.0000 mg | ORAL_TABLET | Freq: Four times a day (QID) | ORAL | 0 refills | Status: AC

## 2023-12-21 SURGICAL SUPPLY — 19 items
BAG URO CATCHER STRL LF (MISCELLANEOUS) ×1 IMPLANT
BASKET ZERO TIP NITINOL 2.4FR (BASKET) IMPLANT
CATH URETL OPEN 5X70 (CATHETERS) ×1 IMPLANT
CLOTH BEACON ORANGE TIMEOUT ST (SAFETY) ×1 IMPLANT
EXTRACTOR STONE 1.7FRX115CM (UROLOGICAL SUPPLIES) IMPLANT
FIBER LASER MOSES 200 DFL (Laser) IMPLANT
FIBER LASER MOSES 365 DFL (Laser) IMPLANT
GLOVE SURG LX STRL 8.0 MICRO (GLOVE) ×1 IMPLANT
GOWN STRL REUS W/ TWL XL LVL3 (GOWN DISPOSABLE) ×1 IMPLANT
GUIDEWIRE ANG ZIPWIRE 038X150 (WIRE) IMPLANT
GUIDEWIRE STR DUAL SENSOR (WIRE) ×1 IMPLANT
KIT TURNOVER KIT A (KITS) IMPLANT
MANIFOLD NEPTUNE II (INSTRUMENTS) ×1 IMPLANT
PACK CYSTO (CUSTOM PROCEDURE TRAY) ×1 IMPLANT
SHEATH NAVIGATOR HD 12/14X28 (SHEATH) IMPLANT
SHEATH NAVIGATOR HD 12/14X36 (SHEATH) IMPLANT
STENT URET 6FRX24 CONTOUR (STENTS) IMPLANT
TUBING CONNECTING 10 (TUBING) ×1 IMPLANT
TUBING UROLOGY SET (TUBING) ×1 IMPLANT

## 2023-12-21 NOTE — Op Note (Signed)
 Preoperative diagnosis: right ureteral calculus  Postoperative diagnosis: right ureteral calculus  Procedure:  Cystoscopy right ureteroscopy and stone removal Ureteroscopic laser lithotripsy right 36F x 24 ureteral stent placement with strings right retrograde pyelography with interpretation  Surgeon: Vilma Prader MD.  Anesthesia: General  Complications: None  Intraoperative findings:  Right UPJ stone posterior to the pelvis Renal pelvis stone fragmented and all fragments removed that were greater than 2 mm in diameter  6 x 24 double-J stent with strings placed in the right ureter  EBL: Minimal  Right retrograde pyelogram interpretation: No perforation noted or extravasation Normal caliber ureters slight narrowing at the UPJ from the previous stent location  Specimens: right ureteral calculus  Disposition of specimens: [facility] for stone analysis  Indication: Judy Chavez is a 48 y.o.   patient with a  right ureteral stone at the UPJ and associated right symptoms. After reviewing the management options for treatment, the patient elected to proceed with the above surgical procedure(s). We have discussed the potential benefits and risks of the procedure, side effects of the proposed treatment, the likelihood of the patient achieving the goals of the procedure, and any potential problems that might occur during the procedure or recuperation. Informed consent has been obtained.   Description of procedure:  The patient was taken to the operating room and general anesthesia was induced.  The patient was placed in the dorsal lithotomy position, prepped and draped in the usual sterile fashion, and preoperative antibiotics were administered. A preoperative time-out was performed.   Cystourethroscopy was performed.  The patient's urethra was examined and was normal. Pan cystoscopy was then performed. There was no evidence for any bladder tumors, stones, or other  mucosal pathology.    Attention then turned to the right ureteral orifice and a 0.38 sensor wire was placed in the right ureter under fluoroscopy.  Placement was confirmed with fluoroscopy.  And a single-lumen semirigid ureteroscope was advanced in the right ureter abnormalities are found within the ureter the stone was encountered in the renal pelvis upon entering the renal pelvis the stone was pushed into the lower pole.  Then a second 0.38 sensor wire was advanced into the right renal pelvis under fluoroscopic guidance   Placement was confirmed by fluoro. The dual lumen catheter was then removed and a ureteral access sheath size 28cm  was inserted over the sensor wire under fluoro to confirm proper placement at the ureteropelvic junction. The obturator and sensor wire were then removed, leaving the other wire on the outside of the sheath as a safety wire.   A flexible ureteroscopy was then advanced through the sheath and into the collecting system. Pan pyeloscopy was then performed.    The stone was then fragmented with a 200 micron holmium laser fiber on a setting of 0.5 and frequency of 15 Hz.    All stones were then removed from the ureter with an N-gage nitinol basket.  Reinspection of the ureter revealed no remaining visible stones or fragments greater than 2 mm in size.  Pan pyeloscopy was then performed and no stones greater than 2mm were visualized.   The sheath was then removed leaving the safety wire in place. Visualization of the ureter on withdrawal of the sheath showed no injury to the ureter.   Prior to exiting the ureter at the UVJ a retrograde pyelogram was performed that demonstrated the above findings.  The safety wire was then back loaded through the 77fr cystoscope. The cystoscope was then driven  into the bladder and a 6x40fr with strings was placed without complication. Proper placement was confirmed with flouro.   The bladder was then emptied and the procedure ended.  The  patient appeared to tolerate the procedure well and without complications.  The patient was able to be awakened and transferred to the recovery unit in satisfactory condition.   Disposition: The tether of the stent was left on and Taped to the left leg.  Instructions for removing the stent have been provided to the patient. The patient has been scheduled for followup in 6 weeks with a renal ultrasound.

## 2023-12-21 NOTE — Anesthesia Postprocedure Evaluation (Signed)
 Anesthesia Post Note  Patient: Judy Chavez  Procedure(s) Performed: Cystoscopy; right ureteroscopy and stone removal;; Ureteroscopic laser lithotripsy; right 56F x 24 ureteral stent placement with strings; right retrograde pyelography with interpretation (Right)     Patient location during evaluation: PACU Anesthesia Type: General Level of consciousness: awake and alert Pain management: pain level controlled Vital Signs Assessment: post-procedure vital signs reviewed and stable Respiratory status: spontaneous breathing, nonlabored ventilation, respiratory function stable and patient connected to nasal cannula oxygen Cardiovascular status: blood pressure returned to baseline and stable Postop Assessment: no apparent nausea or vomiting Anesthetic complications: no   No notable events documented.  Last Vitals:  Vitals:   12/21/23 1500 12/21/23 1525  BP: 129/84 126/82  Pulse: 69 68  Resp: 15   Temp:    SpO2: 100% 99%    Last Pain:  Vitals:   12/21/23 1525  TempSrc:   PainSc: 3                  Solae Norling P Sherrye Puga

## 2023-12-21 NOTE — Anesthesia Preprocedure Evaluation (Signed)
 Anesthesia Evaluation  Patient identified by MRN, date of birth, ID band Patient awake    Reviewed: Allergy & Precautions, NPO status , Patient's Chart, lab work & pertinent test results  Airway Mallampati: II  TM Distance: >3 FB Neck ROM: Full    Dental no notable dental hx.    Pulmonary neg pulmonary ROS   Pulmonary exam normal        Cardiovascular hypertension,  Rhythm:Regular Rate:Normal     Neuro/Psych negative neurological ROS  negative psych ROS   GI/Hepatic negative GI ROS, Neg liver ROS,,,  Endo/Other  negative endocrine ROS    Renal/GU negative Renal ROS  negative genitourinary   Musculoskeletal negative musculoskeletal ROS (+)    Abdominal Normal abdominal exam  (+)   Peds  Hematology negative hematology ROS (+)   Anesthesia Other Findings   Reproductive/Obstetrics                             Anesthesia Physical Anesthesia Plan  ASA: 2  Anesthesia Plan: General   Post-op Pain Management:    Induction: Intravenous  PONV Risk Score and Plan: 3 and Ondansetron, Dexamethasone, Midazolam and Treatment may vary due to age or medical condition  Airway Management Planned: Mask and LMA  Additional Equipment: None  Intra-op Plan:   Post-operative Plan: Extubation in OR  Informed Consent: I have reviewed the patients History and Physical, chart, labs and discussed the procedure including the risks, benefits and alternatives for the proposed anesthesia with the patient or authorized representative who has indicated his/her understanding and acceptance.     Dental advisory given  Plan Discussed with: CRNA  Anesthesia Plan Comments:        Anesthesia Quick Evaluation

## 2023-12-21 NOTE — OR Nursing (Signed)
 Judy Chavez witness for specimens

## 2023-12-21 NOTE — Discharge Instructions (Signed)
 DISCHARGE INSTRUCTIONS FOR Ureteroscopy and/or Ureteral Stent Placement  MEDICATIONS:  1.  Robaxin 2. Tamsulosin  3. Hyoscyamine  4. Tamsulosin   ACTIVITY:  1. No strenuous activity x 1week  2. No driving while on narcotic pain medications  3. Drink plenty of water  4. Continue to walk at home - it is normal to see blood in the urine while the stent is in place, so keep active, but don't over do it.  5. May return to work/school tomorrow or when you feel ready  6. You may experience some pain when urinating in the kidney on the side that was operated on while the stent is in place this is normal  WHAT IS NORMAL TO EXPERIENCE: It is normal to feel the urge to urinate while the stent is in place It is normal to have blood in your urine while the stent is in place  It sometimes can be normal to have pain in your kidney when you urinate   BATHING:  1. You can shower and we recommend daily showers  2. You have a string coming from your urethra: The stent string is attached to your ureteral stent. Do not pull on this until the date you are instructed to   DIET: You may return to your normal diet immediately. Because of the raw surface of your bladder, alcohol, spicy foods, acid type foods and drinks with caffeine may cause irritation or frequency and should be used in moderation. To keep your urine flowing freely and to avoid constipation, drink plenty of fluids during the day ( 8-10 glasses ). Tip: Avoid cranberry juice because it is very acidic.  SIGNS/SYMPTOMS TO CALL:  Please call us if you have a fever greater than 101.5, uncontrolled nausea/vomiting, uncontrolled pain, dizziness, unable to urinate, bloody urine with clots greater than the size of a quarter, chest pain, shortness of breath, leg swelling, leg pain, redness around wound, drainage from wound, or any other concerns or questions.   You can reach [place] at [phone].   FOLLOW-UP:  1. You may remove your stent in on Monday  12/25/23. To do this go into the shower, grab hold of the tether coming from your urethra. Pull the tether consistent motion until the stent is removed from your body. The stent will be around 10 inches long with a curl on either end.  2. You you have been set up for f/u in 6-8 weeks

## 2023-12-21 NOTE — Anesthesia Procedure Notes (Signed)
 Procedure Name: LMA Insertion Date/Time: 12/21/2023 1:19 PM  Performed by: Deri Fuelling, CRNAPatient Re-evaluated:Patient Re-evaluated prior to induction Oxygen Delivery Method: Circle system utilized Preoxygenation: Pre-oxygenation with 100% oxygen Induction Type: IV induction LMA: LMA inserted LMA Size: 4.0 Number of attempts: 1 Placement Confirmation: positive ETCO2 and breath sounds checked- equal and bilateral

## 2023-12-21 NOTE — Transfer of Care (Signed)
 Immediate Anesthesia Transfer of Care Note  Patient: Judy Chavez  Procedure(s) Performed: Cystoscopy; right ureteroscopy and stone removal;; Ureteroscopic laser lithotripsy; right 8F x 24 ureteral stent placement with strings; right retrograde pyelography with interpretation (Right)  Patient Location: PACU  Anesthesia Type:General  Level of Consciousness: awake and alert   Airway & Oxygen Therapy: Patient Spontanous Breathing and Patient connected to nasal cannula oxygen  Post-op Assessment: Report given to RN and Post -op Vital signs reviewed and stable  Post vital signs: Reviewed and stable  Last Vitals:  Vitals Value Taken Time  BP 134/74 12/21/23 1445  Temp    Pulse 92 12/21/23 1448  Resp 15 12/21/23 1448  SpO2 97 % 12/21/23 1448  Vitals shown include unfiled device data.  Last Pain:  Vitals:   12/21/23 1040  TempSrc:   PainSc: 0-No pain         Complications: No notable events documented.

## 2023-12-22 ENCOUNTER — Encounter (HOSPITAL_COMMUNITY): Payer: Self-pay | Admitting: Urology

## 2023-12-25 ENCOUNTER — Emergency Department (HOSPITAL_COMMUNITY)

## 2023-12-25 ENCOUNTER — Other Ambulatory Visit: Payer: Self-pay

## 2023-12-25 ENCOUNTER — Encounter (HOSPITAL_COMMUNITY): Payer: Self-pay

## 2023-12-25 ENCOUNTER — Emergency Department (HOSPITAL_COMMUNITY)
Admission: EM | Admit: 2023-12-25 | Discharge: 2023-12-25 | Disposition: A | Discharge status: Home / Self Care | Attending: Emergency Medicine | Admitting: Emergency Medicine

## 2023-12-25 DIAGNOSIS — R109 Unspecified abdominal pain: Secondary | ICD-10-CM | POA: Insufficient documentation

## 2023-12-25 DIAGNOSIS — G8918 Other acute postprocedural pain: Secondary | ICD-10-CM

## 2023-12-25 MED ORDER — OXYCODONE-ACETAMINOPHEN 5-325 MG PO TABS: 1.0000 | ORAL_TABLET | Freq: Four times a day (QID) | ORAL | 0 refills | Status: AC | PRN

## 2023-12-25 NOTE — ED Provider Notes (Signed)
 [place] EMERGENCY DEPARTMENT AT [facility] Provider Note   CSN: [id number] Arrival date & time: 12/25/23  1055     History  Chief Complaint  Patient presents with   Abdominal Pain    Judy Chavez is a 48 y.o. female.  48 year old female presents with right-sided flank pain after removing a ureteral stent.  Patient notes that she initially she was doing okay but then developed sharp pain in her right flank.  Notes she is been able to urinate since then and denies any hematuria.  Called EMS and was given 200 mcg of fentanyl which helped her symptoms.  States she feels better at this time.       Home Medications Prior to Admission medications   Medication Sig Start Date End Date Taking? Authorizing Provider  hyoscyamine (ANASPAZ) 0.125 MG TBDP disintergrating tablet Place 1 tablet (0.125 mg total) under the tongue every 6 (six) hours as needed for up to 20 doses. 12/21/23   Adonis Brook, MD  methocarbamol (ROBAXIN) 750 MG tablet Take 1 tablet (750 mg total) by mouth 4 (four) times daily for 20 doses. 12/21/23 12/26/23  Adonis Brook, MD  Multiple Vitamins-Minerals (MULTIVITAMIN WITH MINERALS) tablet Take by mouth.    [provider]  tamsulosin (FLOMAX) 0.4 MG CAPS capsule Take 1 capsule (0.4 mg total) by mouth daily after supper. 12/21/23   Adonis Brook, MD  telmisartan (MICARDIS) 40 MG tablet Take 40 mg by mouth daily. 11/25/16   [provider]      Allergies    Amlodipine    Review of Systems   Review of Systems  All other systems reviewed and are negative.   Physical Exam Updated Vital Signs BP 125/80 (BP Location: Right Arm)   Pulse 68   Temp 98.1 F (36.7 C) (Oral)   Resp 20   Ht 1.626 m (5\' 4" )   Wt 64.9 kg   LMP  (LMP Unknown) Comment: negative urine preg as of 12/21/23  SpO2 99%   BMI 24.55 kg/m  Physical Exam Vitals and nursing note reviewed.  Constitutional:      General: She is not in acute distress.     Appearance: Normal appearance. She is well-developed. She is not toxic-appearing.  HENT:     Head: Normocephalic and atraumatic.  Eyes:     General: Lids are normal.     Conjunctiva/sclera: Conjunctivae normal.     Pupils: Pupils are equal, round, and reactive to light.  Neck:     Thyroid: No thyroid mass.     Trachea: No tracheal deviation.  Cardiovascular:     Rate and Rhythm: Normal rate and regular rhythm.     Heart sounds: Normal heart sounds. No murmur heard.    No gallop.  Pulmonary:     Effort: Pulmonary effort is normal. No respiratory distress.     Breath sounds: Normal breath sounds. No stridor. No decreased breath sounds, wheezing, rhonchi or rales.  Abdominal:     General: There is no distension.     Palpations: Abdomen is soft.     Tenderness: There is no abdominal tenderness. There is no rebound.  Musculoskeletal:        General: No tenderness. Normal range of motion.     Cervical back: Normal range of motion and neck supple.  Skin:    General: Skin is warm and dry.     Findings: No abrasion or rash.  Neurological:     Mental Status: She  is alert and oriented to person, place, and time. Mental status is at baseline.     GCS: GCS eye subscore is 4. GCS verbal subscore is 5. GCS motor subscore is 6.     Cranial Nerves: No cranial nerve deficit.     Sensory: No sensory deficit.     Motor: Motor function is intact.  Psychiatric:        Attention and Perception: Attention normal.        Speech: Speech normal.        Behavior: Behavior normal.     ED Results / Procedures / Treatments   Labs (all labs ordered are listed, but only abnormal results are displayed) Labs Reviewed - No data to display  EKG None  Radiology No results found.  Procedures Procedures    Medications Ordered in ED Medications - No data to display  ED Course/ Medical Decision Making/ A&P                                 Medical Decision Making Amount and/or Complexity of Data  Reviewed Radiology: ordered.   Patient has controlled pain at this time.  Repeat CT scan shows mild right-sided hydro.  Hematuria here.  No other urinary symptoms.  Plan will be to prescribe opiate medications.  She will follow-up with her urologist        Final Clinical Impression(s) / ED Diagnoses Final diagnoses:  None    Rx / DC Orders ED Discharge Orders     None         Lorre Nick, MD 12/25/23 1521

## 2023-12-25 NOTE — ED Triage Notes (Signed)
 BIB EMS from home, pt had stints put in Thursday, PT instructed to remove stint today and did, PT states she had severe pain in RLQ   10/10 pain before meds given 200 mc fentanyl given by EMS

## 2023-12-25 NOTE — Discharge Instructions (Signed)
 Call the urologist today to schedule a follow-up visit.  Return here if you should develop pain that is not controlled with the pain pills, fever, blood in your urine, or any other problems

## 2023-12-28 LAB — CALCULI, WITH PHOTOGRAPH (CLINICAL LAB)
Calcium Oxalate Dihydrate: 20 %
Calcium Oxalate Monohydrate: 80 %
Weight Calculi: 27 mg
